# Patient Record
Sex: Male | Born: 1975 | Race: Black or African American | Hispanic: No | Marital: Married | Smoking: Current every day smoker
Health system: Southern US, Community
[De-identification: ages and names within clinical notes are randomized; demographics above are authoritative.]

## PROBLEM LIST (undated history)

## (undated) DIAGNOSIS — J45909 Unspecified asthma, uncomplicated: Secondary | ICD-10-CM

---

## 2010-05-12 ENCOUNTER — Emergency Department (HOSPITAL_COMMUNITY): Admission: EM | Admit: 2010-05-12 | Discharge: 2010-05-12 | Payer: Self-pay | Admitting: *Deleted

## 2013-07-20 ENCOUNTER — Observation Stay (HOSPITAL_COMMUNITY)
Admission: EM | Admit: 2013-07-20 | Discharge: 2013-07-21 | Disposition: A | Discharge status: Home / Self Care | Payer: Self-pay | Attending: General Surgery | Admitting: General Surgery

## 2013-07-20 ENCOUNTER — Emergency Department (HOSPITAL_COMMUNITY): Payer: Self-pay

## 2013-07-20 ENCOUNTER — Encounter (HOSPITAL_COMMUNITY): Payer: Self-pay | Admitting: Emergency Medicine

## 2013-07-20 DIAGNOSIS — Z23 Encounter for immunization: Secondary | ICD-10-CM | POA: Insufficient documentation

## 2013-07-20 DIAGNOSIS — Y9229 Other specified public building as the place of occurrence of the external cause: Secondary | ICD-10-CM | POA: Insufficient documentation

## 2013-07-20 DIAGNOSIS — Y998 Other external cause status: Secondary | ICD-10-CM | POA: Insufficient documentation

## 2013-07-20 DIAGNOSIS — S71009A Unspecified open wound, unspecified hip, initial encounter: Principal | ICD-10-CM | POA: Insufficient documentation

## 2013-07-20 DIAGNOSIS — D62 Acute posthemorrhagic anemia: Secondary | ICD-10-CM | POA: Insufficient documentation

## 2013-07-20 DIAGNOSIS — S71109A Unspecified open wound, unspecified thigh, initial encounter: Principal | ICD-10-CM | POA: Insufficient documentation

## 2013-07-20 DIAGNOSIS — S81802A Unspecified open wound, left lower leg, initial encounter: Secondary | ICD-10-CM

## 2013-07-20 DIAGNOSIS — I959 Hypotension, unspecified: Secondary | ICD-10-CM | POA: Insufficient documentation

## 2013-07-20 HISTORY — DX: Unspecified asthma, uncomplicated: J45.909

## 2013-07-20 LAB — PROTIME-INR: Prothrombin Time: 14.3 seconds (ref 11.6–15.2)

## 2013-07-20 LAB — POCT I-STAT, CHEM 8
Calcium, Ion: 1.07 mmol/L — ABNORMAL LOW (ref 1.12–1.23)
Creatinine, Ser: 1.6 mg/dL — ABNORMAL HIGH (ref 0.50–1.35)
Glucose, Bld: 113 mg/dL — ABNORMAL HIGH (ref 70–99)
HCT: 43 % (ref 39.0–52.0)
Hemoglobin: 14.6 g/dL (ref 13.0–17.0)
Potassium: 3.1 mEq/L — ABNORMAL LOW (ref 3.5–5.1)

## 2013-07-20 LAB — BASIC METABOLIC PANEL
Calcium: 8.4 mg/dL (ref 8.4–10.5)
GFR calc Af Amer: 89 mL/min — ABNORMAL LOW (ref >90)
Sodium: 141 mEq/L (ref 135–145)

## 2013-07-20 LAB — CBC WITH DIFFERENTIAL/PLATELET
Basophils Absolute: 0 10*3/uL (ref 0.0–0.1)
Eosinophils Absolute: 0.1 10*3/uL (ref 0.0–0.7)
Eosinophils Relative: 2 % (ref 0–5)
HCT: 38.6 % — ABNORMAL LOW (ref 39.0–52.0)
Hemoglobin: 14.1 g/dL (ref 13.0–17.0)
Lymphocytes Relative: 62 % — ABNORMAL HIGH (ref 12–46)
MCH: 32.7 pg (ref 26.0–34.0)
MCHC: 36.5 g/dL — ABNORMAL HIGH (ref 30.0–36.0)
MCV: 89.6 fL (ref 78.0–100.0)
Monocytes Absolute: 0.5 10*3/uL (ref 0.1–1.0)
Platelets: 187 10*3/uL (ref 150–400)
RBC: 4.31 MIL/uL (ref 4.22–5.81)
RDW: 13.2 % (ref 11.5–15.5)

## 2013-07-20 LAB — CG4 I-STAT (LACTIC ACID): Lactic Acid, Venous: 5.69 mmol/L — ABNORMAL HIGH (ref 0.5–2.2)

## 2013-07-20 LAB — TYPE AND SCREEN: ABO/RH(D): O POS

## 2013-07-20 MED ORDER — TETANUS-DIPHTH-ACELL PERTUSSIS 5-2.5-18.5 LF-MCG/0.5 IM SUSP
0.5000 mL | Freq: Once | INTRAMUSCULAR | Status: AC
  Administered 2013-07-20: 0.5 mL via INTRAMUSCULAR
  Filled 2013-07-20: qty 0.5

## 2013-07-20 MED ORDER — PREDNISONE 20 MG PO TABS
60.0000 mg | ORAL_TABLET | Freq: Once | ORAL | Status: DC
  Filled 2013-07-20: qty 3

## 2013-07-20 MED ORDER — SODIUM CHLORIDE 0.9 % IV BOLUS (SEPSIS)
1000.0000 mL | Freq: Once | INTRAVENOUS | Status: AC
  Administered 2013-07-20: 1000 mL via INTRAVENOUS

## 2013-07-20 MED ORDER — ONDANSETRON HCL 4 MG/2ML IJ SOLN
4.0000 mg | Freq: Once | INTRAMUSCULAR | Status: AC
  Administered 2013-07-20: 4 mg via INTRAVENOUS
  Filled 2013-07-20: qty 2

## 2013-07-20 MED ORDER — IOHEXOL 350 MG/ML SOLN
100.0000 mL | Freq: Once | INTRAVENOUS | Status: AC | PRN
Start: 2013-07-20 — End: 2013-07-20
  Administered 2013-07-20: 100 mL via INTRAVENOUS

## 2013-07-20 MED ORDER — MORPHINE SULFATE 4 MG/ML IJ SOLN
4.0000 mg | INTRAMUSCULAR | Status: AC | PRN
  Administered 2013-07-20 – 2013-07-21 (×3): 4 mg via INTRAVENOUS
  Filled 2013-07-20 (×3): qty 1

## 2013-07-20 MED ORDER — CEFAZOLIN SODIUM 1-5 GM-% IV SOLN
1.0000 g | Freq: Once | INTRAVENOUS | Status: AC
  Administered 2013-07-20: 1 g via INTRAVENOUS
  Filled 2013-07-20: qty 50

## 2013-07-20 NOTE — ED Notes (Signed)
Warm blanket applied
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
## 2013-07-20 NOTE — ED Notes (Signed)
Family updated as to patient's status.
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
## 2013-07-20 NOTE — ED Notes (Signed)
Tourniquet removed by Dr. Fayrene Fearing, EDP and resident Dr. Durward Fortes, - no bleeding noted to wound upon removal. Will continue to monitor.

## 2013-07-21 ENCOUNTER — Encounter (HOSPITAL_COMMUNITY): Payer: Self-pay | Admitting: General Surgery

## 2013-07-21 DIAGNOSIS — D62 Acute posthemorrhagic anemia: Secondary | ICD-10-CM

## 2013-07-21 DIAGNOSIS — S81809A Unspecified open wound, unspecified lower leg, initial encounter: Secondary | ICD-10-CM

## 2013-07-21 DIAGNOSIS — S81009A Unspecified open wound, unspecified knee, initial encounter: Secondary | ICD-10-CM

## 2013-07-21 DIAGNOSIS — S91009A Unspecified open wound, unspecified ankle, initial encounter: Secondary | ICD-10-CM

## 2013-07-21 DIAGNOSIS — I959 Hypotension, unspecified: Secondary | ICD-10-CM

## 2013-07-21 LAB — CBC WITH DIFFERENTIAL/PLATELET
Basophils Absolute: 0 10*3/uL (ref 0.0–0.1)
Basophils Relative: 0 % (ref 0–1)
Basophils Relative: 0 % (ref 0–1)
Eosinophils Relative: 1 % (ref 0–5)
Eosinophils Relative: 1 % (ref 0–5)
HCT: 35.2 % — ABNORMAL LOW (ref 39.0–52.0)
Lymphocytes Relative: 26 % (ref 12–46)
MCH: 31.5 pg (ref 26.0–34.0)
MCHC: 35.8 g/dL (ref 30.0–36.0)
MCV: 89.8 fL (ref 78.0–100.0)
Monocytes Absolute: 0.5 10*3/uL (ref 0.1–1.0)
Monocytes Absolute: 0.8 10*3/uL (ref 0.1–1.0)
Monocytes Relative: 11 % (ref 3–12)
Neutro Abs: 4.3 10*3/uL (ref 1.7–7.7)
Neutro Abs: 4.4 10*3/uL (ref 1.7–7.7)
Neutrophils Relative %: 65 % (ref 43–77)
Platelets: 169 10*3/uL (ref 150–400)
RBC: 3.68 MIL/uL — ABNORMAL LOW (ref 4.22–5.81)
RBC: 3.92 MIL/uL — ABNORMAL LOW (ref 4.22–5.81)
RDW: 13.5 % (ref 11.5–15.5)
WBC: 6.7 10*3/uL (ref 4.0–10.5)
WBC: 7.7 10*3/uL (ref 4.0–10.5)

## 2013-07-21 LAB — CBC
Hemoglobin: 10.8 g/dL — ABNORMAL LOW (ref 13.0–17.0)
MCHC: 35.6 g/dL (ref 30.0–36.0)
Platelets: 138 10*3/uL — ABNORMAL LOW (ref 150–400)
RDW: 13.7 % (ref 11.5–15.5)
WBC: 7.5 10*3/uL (ref 4.0–10.5)

## 2013-07-21 LAB — BASIC METABOLIC PANEL
Chloride: 108 mEq/L (ref 96–112)
GFR calc Af Amer: >90 mL/min (ref >90)
GFR calc non Af Amer: >90 mL/min (ref >90)
Glucose, Bld: 122 mg/dL — ABNORMAL HIGH (ref 70–99)
Potassium: 3.9 mEq/L (ref 3.5–5.1)
Sodium: 140 mEq/L (ref 135–145)

## 2013-07-21 LAB — POCT I-STAT, CHEM 8
BUN: 8 mg/dL (ref 6–23)
Calcium, Ion: 1.08 mmol/L — ABNORMAL LOW (ref 1.12–1.23)
Chloride: 104 mEq/L (ref 96–112)
HCT: 36 % — ABNORMAL LOW (ref 39.0–52.0)
Hemoglobin: 12.2 g/dL — ABNORMAL LOW (ref 13.0–17.0)
Sodium: 141 meq/L (ref 135–145)
TCO2: 21 mmol/L (ref 0–100)

## 2013-07-21 LAB — CG4 I-STAT (LACTIC ACID): Lactic Acid, Venous: 3.68 mmol/L — ABNORMAL HIGH (ref 0.5–2.2)

## 2013-07-21 MED ORDER — SODIUM CHLORIDE 0.9 % IV SOLN
Freq: Once | INTRAVENOUS | Status: AC
  Administered 2013-07-21: 04:00:00 via INTRAVENOUS

## 2013-07-21 MED ORDER — CEPHALEXIN 500 MG PO CAPS: 500.0000 mg | ORAL_CAPSULE | Freq: Four times a day (QID) | ORAL | Status: DC

## 2013-07-21 MED ORDER — ALBUTEROL SULFATE HFA 108 (90 BASE) MCG/ACT IN AERS: 2.0000 | INHALATION_SPRAY | Freq: Four times a day (QID) | RESPIRATORY_TRACT | Status: DC | PRN

## 2013-07-21 MED ORDER — INFLUENZA VAC SPLIT QUAD 0.5 ML IM SUSP
0.5000 mL | Freq: Once | INTRAMUSCULAR | Status: AC
  Administered 2013-07-21: 0.5 mL via INTRAMUSCULAR
  Filled 2013-07-21: qty 0.5

## 2013-07-21 MED ORDER — KCL IN DEXTROSE-NACL 20-5-0.9 MEQ/L-%-% IV SOLN
INTRAVENOUS | Status: DC
  Administered 2013-07-21: 06:00:00 via INTRAVENOUS
  Filled 2013-07-21 (×3): qty 1000

## 2013-07-21 MED ORDER — NAPROXEN 500 MG PO TABS: 500.0000 mg | ORAL_TABLET | Freq: Two times a day (BID) | ORAL | Status: AC

## 2013-07-21 MED ORDER — PANTOPRAZOLE SODIUM 40 MG IV SOLR
40.0000 mg | Freq: Every day | INTRAVENOUS | Status: DC
  Filled 2013-07-21: qty 40

## 2013-07-21 MED ORDER — HYDROCODONE-ACETAMINOPHEN 5-325 MG PO TABS
1.0000 | ORAL_TABLET | ORAL | Status: DC | PRN
  Administered 2013-07-21 (×2): 1 via ORAL
  Filled 2013-07-21 (×2): qty 1

## 2013-07-21 MED ORDER — ONDANSETRON HCL 4 MG PO TABS: 4.0000 mg | ORAL_TABLET | Freq: Four times a day (QID) | ORAL | Status: DC | PRN

## 2013-07-21 MED ORDER — OXYCODONE-ACETAMINOPHEN 5-325 MG PO TABS
1.0000 | ORAL_TABLET | Freq: Once | ORAL | Status: AC
  Administered 2013-07-21: 1 via ORAL
  Filled 2013-07-21: qty 1

## 2013-07-21 MED ORDER — INFLUENZA VAC SPLIT QUAD 0.5 ML IM SUSP: 0.5000 mL | INTRAMUSCULAR | Status: DC

## 2013-07-21 MED ORDER — HYDROCODONE-ACETAMINOPHEN 5-325 MG PO TABS: 1.0000 | ORAL_TABLET | ORAL | Status: DC | PRN

## 2013-07-21 MED ORDER — ONDANSETRON HCL 4 MG/2ML IJ SOLN: 4.0000 mg | Freq: Four times a day (QID) | INTRAMUSCULAR | Status: DC | PRN

## 2013-07-21 MED ORDER — PANTOPRAZOLE SODIUM 40 MG PO TBEC
40.0000 mg | DELAYED_RELEASE_TABLET | Freq: Every day | ORAL | Status: DC
  Administered 2013-07-21: 40 mg via ORAL
  Filled 2013-07-21: qty 1

## 2013-07-21 NOTE — ED Notes (Signed)
D/c instructions reviewed w/ pt - pt informed to have wound evaluated by Saturday. Rx given x2, Pt instructed to not use alcohol, drive, or operate heavy machinery while take the prescription pain medications as they could make him drowsy - pt verbalized understanding.  Pt ambulating independently w/ steady gait on d/c in no acute distress, A&Ox4.
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
## 2013-07-21 NOTE — Progress Notes (Signed)
Discussed discharge instructions and medications with patient. Patient verbalized understanding with all questions answered. VSS. Pt discharged home via taxi. Voucher received from Gregory, Child psychotherapist. Patient placed in paper scrubs to discharge home.  [facility]
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
## 2013-07-21 NOTE — ED Provider Notes (Addendum)
I saw and evaluated the patient, reviewed the resident's note and I agree with the findings and plan.   Please see my extensive attending note dictated above.  I agree with Resident's EKG interpretation.  Roney Marion, MD 07/21/13 1421  Roney Marion, MD 07/29/13 [phone]
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
## 2013-07-21 NOTE — ED Notes (Signed)
Upon standing pt to d/c pt via wheelchair, pt c/o acute onset of dizziness, pt laid back down in bed and orthostatic VS obtained.

## 2013-07-24 NOTE — Progress Notes (Signed)
G-Codes for PT evaluation performed on 07-22-13  07-25-2013 0800  PT G-Codes **NOT FOR INPATIENT CLASS**  Functional Limitation Mobility: Walking and moving around  Mobility: Walking and Moving Around Current Status [phone]) CI  Mobility: Walking and Moving Around Goal Status [phone]) [facility]    [place], [place] DPT [phone]

## 2013-08-03 ENCOUNTER — Encounter (INDEPENDENT_AMBULATORY_CARE_PROVIDER_SITE_OTHER): Payer: Self-pay

## 2013-08-03 ENCOUNTER — Telehealth (INDEPENDENT_AMBULATORY_CARE_PROVIDER_SITE_OTHER): Payer: Self-pay

## 2013-08-03 NOTE — Telephone Encounter (Signed)
Spoke to the patient, he has no insurance and no way to get here at this time.

## 2013-12-07 ENCOUNTER — Emergency Department (HOSPITAL_COMMUNITY)
Admission: EM | Admit: 2013-12-07 | Discharge: 2013-12-07 | Disposition: A | Discharge status: Home / Self Care | Payer: No Typology Code available for payment source | Attending: Emergency Medicine | Admitting: Emergency Medicine

## 2013-12-07 ENCOUNTER — Emergency Department (HOSPITAL_COMMUNITY): Payer: No Typology Code available for payment source

## 2013-12-07 DIAGNOSIS — M795 Residual foreign body in soft tissue: Secondary | ICD-10-CM | POA: Insufficient documentation

## 2013-12-07 DIAGNOSIS — F17200 Nicotine dependence, unspecified, uncomplicated: Secondary | ICD-10-CM | POA: Insufficient documentation

## 2013-12-07 DIAGNOSIS — Z79899 Other long term (current) drug therapy: Secondary | ICD-10-CM | POA: Insufficient documentation

## 2013-12-07 DIAGNOSIS — J45909 Unspecified asthma, uncomplicated: Secondary | ICD-10-CM | POA: Insufficient documentation

## 2013-12-07 MED ORDER — CEPHALEXIN 500 MG PO CAPS: 500.0000 mg | ORAL_CAPSULE | Freq: Four times a day (QID) | ORAL | Status: AC

## 2013-12-07 MED ORDER — HYDROCODONE-ACETAMINOPHEN 5-325 MG PO TABS
1.0000 | ORAL_TABLET | Freq: Once | ORAL | Status: AC
  Administered 2013-12-07: 1 via ORAL
  Filled 2013-12-07: qty 1

## 2013-12-07 MED ORDER — HYDROCODONE-ACETAMINOPHEN 5-325 MG PO TABS: 1.0000 | ORAL_TABLET | ORAL | Status: AC | PRN

## 2013-12-07 NOTE — ED Provider Notes (Signed)
Medical screening examination/treatment/procedure(s) were performed by non-physician practitioner and as supervising physician I was immediately available for consultation/collaboration.   EKG Interpretation None      Devoria AlbeIva Aizley Stenseth, MD, Armando GangFACEP   Ward GivensIva L Kaylah Chiasson, MD 12/07/13 1535

## 2013-12-07 NOTE — ED Notes (Signed)
Pt ambulates without distress. Alert x4 respirations easy non labored 
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
## 2013-12-07 NOTE — ED Notes (Signed)
Patient returned from xray to room.
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
